# Patient Record
Sex: Female | Born: 1945
Health system: Southern US, Community
[De-identification: ages and names within clinical notes are randomized; demographics above are authoritative.]

## PROBLEM LIST (undated history)

## (undated) DIAGNOSIS — J302 Other seasonal allergic rhinitis: Secondary | ICD-10-CM

## (undated) DIAGNOSIS — K219 Gastro-esophageal reflux disease without esophagitis: Secondary | ICD-10-CM

## (undated) DIAGNOSIS — E7800 Pure hypercholesterolemia, unspecified: Secondary | ICD-10-CM

## (undated) DIAGNOSIS — K5792 Diverticulitis of intestine, part unspecified, without perforation or abscess without bleeding: Secondary | ICD-10-CM

## (undated) DIAGNOSIS — M5030 Other cervical disc degeneration, unspecified cervical region: Secondary | ICD-10-CM

## (undated) DIAGNOSIS — R42 Dizziness and giddiness: Secondary | ICD-10-CM

## (undated) DIAGNOSIS — F419 Anxiety disorder, unspecified: Secondary | ICD-10-CM

## (undated) DIAGNOSIS — M1990 Unspecified osteoarthritis, unspecified site: Secondary | ICD-10-CM

## (undated) HISTORY — PX: CATARACT EXTRACTION: SUR2

## (undated) HISTORY — PX: BREAST BIOPSY: SHX20

## (undated) HISTORY — PX: TONSILLECTOMY: SUR1361

## (undated) HISTORY — PX: COLONOSCOPY: SHX174

## (undated) HISTORY — PX: APPENDECTOMY: SHX54

## (undated) HISTORY — PX: ABDOMINAL HYSTERECTOMY: SHX81

---

## 2004-03-12 ENCOUNTER — Ambulatory Visit: Payer: Self-pay | Admitting: Family Medicine

## 2005-09-17 ENCOUNTER — Ambulatory Visit: Payer: Self-pay | Admitting: Family Medicine

## 2006-09-22 ENCOUNTER — Ambulatory Visit: Payer: Self-pay | Admitting: Family Medicine

## 2007-10-25 ENCOUNTER — Ambulatory Visit: Payer: Self-pay | Admitting: Family Medicine

## 2008-10-26 ENCOUNTER — Ambulatory Visit: Payer: Self-pay | Admitting: Family Medicine

## 2009-03-01 ENCOUNTER — Ambulatory Visit: Payer: Self-pay | Admitting: Ophthalmology

## 2009-03-12 ENCOUNTER — Ambulatory Visit: Payer: Self-pay | Admitting: Ophthalmology

## 2009-12-17 ENCOUNTER — Ambulatory Visit: Payer: Self-pay | Admitting: Family Medicine

## 2010-01-15 ENCOUNTER — Ambulatory Visit: Payer: Self-pay | Admitting: Gastroenterology

## 2010-07-25 ENCOUNTER — Ambulatory Visit: Payer: Self-pay | Admitting: Ophthalmology

## 2010-08-05 ENCOUNTER — Ambulatory Visit: Payer: Self-pay | Admitting: Ophthalmology

## 2011-01-15 ENCOUNTER — Ambulatory Visit: Payer: Self-pay | Admitting: Family Medicine

## 2011-05-01 ENCOUNTER — Ambulatory Visit: Payer: Self-pay | Admitting: General Practice

## 2011-05-20 ENCOUNTER — Ambulatory Visit: Payer: Self-pay | Admitting: General Practice

## 2012-02-04 ENCOUNTER — Ambulatory Visit: Payer: Self-pay | Admitting: Family Medicine

## 2013-02-11 ENCOUNTER — Ambulatory Visit: Payer: Self-pay | Admitting: Family Medicine

## 2014-03-02 ENCOUNTER — Ambulatory Visit: Payer: Self-pay | Admitting: Family Medicine

## 2015-01-17 ENCOUNTER — Encounter: Payer: Self-pay | Admitting: *Deleted

## 2015-01-26 NOTE — Discharge Instructions (Signed)

## 2015-01-30 ENCOUNTER — Encounter
Admission: RE | Disposition: A | Discharge status: Home / Self Care | Payer: Self-pay | Source: Ambulatory Visit | Attending: Gastroenterology

## 2015-01-30 ENCOUNTER — Ambulatory Visit
Admission: RE | Admit: 2015-01-30 | Discharge: 2015-01-30 | Disposition: A | Discharge status: Home / Self Care | Payer: PPO | Source: Ambulatory Visit | Attending: Gastroenterology | Admitting: Gastroenterology

## 2015-01-30 ENCOUNTER — Ambulatory Visit: Payer: PPO | Admitting: Anesthesiology

## 2015-01-30 ENCOUNTER — Encounter: Payer: Self-pay | Admitting: *Deleted

## 2015-01-30 DIAGNOSIS — Z800 Family history of malignant neoplasm of digestive organs: Secondary | ICD-10-CM | POA: Diagnosis not present

## 2015-01-30 DIAGNOSIS — Z87891 Personal history of nicotine dependence: Secondary | ICD-10-CM | POA: Diagnosis not present

## 2015-01-30 DIAGNOSIS — R194 Change in bowel habit: Secondary | ICD-10-CM | POA: Insufficient documentation

## 2015-01-30 DIAGNOSIS — K219 Gastro-esophageal reflux disease without esophagitis: Secondary | ICD-10-CM | POA: Insufficient documentation

## 2015-01-30 DIAGNOSIS — K5730 Diverticulosis of large intestine without perforation or abscess without bleeding: Secondary | ICD-10-CM | POA: Diagnosis not present

## 2015-01-30 HISTORY — DX: Anxiety disorder, unspecified: F41.9

## 2015-01-30 HISTORY — DX: Dizziness and giddiness: R42

## 2015-01-30 HISTORY — DX: Diverticulitis of intestine, part unspecified, without perforation or abscess without bleeding: K57.92

## 2015-01-30 HISTORY — PX: COLONOSCOPY: SHX5424

## 2015-01-30 HISTORY — DX: Other seasonal allergic rhinitis: J30.2

## 2015-01-30 HISTORY — DX: Pure hypercholesterolemia, unspecified: E78.00

## 2015-01-30 HISTORY — DX: Gastro-esophageal reflux disease without esophagitis: K21.9

## 2015-01-30 HISTORY — DX: Unspecified osteoarthritis, unspecified site: M19.90

## 2015-01-30 HISTORY — DX: Other cervical disc degeneration, unspecified cervical region: M50.30

## 2015-01-30 SURGERY — COLONOSCOPY
Anesthesia: General | Wound class: Contaminated

## 2015-01-30 MED ORDER — DEXAMETHASONE SODIUM PHOSPHATE 4 MG/ML IJ SOLN: 8.0000 mg | Freq: Once | INTRAMUSCULAR | Status: DC | PRN

## 2015-01-30 MED ORDER — LACTATED RINGERS IV SOLN
INTRAVENOUS | Status: DC
  Administered 2015-01-30: 08:00:00 via INTRAVENOUS

## 2015-01-30 MED ORDER — ACETAMINOPHEN 160 MG/5ML PO SOLN: 325.0000 mg | ORAL | Status: DC | PRN

## 2015-01-30 MED ORDER — FENTANYL CITRATE (PF) 100 MCG/2ML IJ SOLN: 25.0000 ug | INTRAMUSCULAR | Status: DC | PRN

## 2015-01-30 MED ORDER — OXYCODONE HCL 5 MG PO TABS: 5.0000 mg | ORAL_TABLET | Freq: Once | ORAL | Status: DC | PRN

## 2015-01-30 MED ORDER — LIDOCAINE HCL (CARDIAC) 20 MG/ML IV SOLN
INTRAVENOUS | Status: DC | PRN
  Administered 2015-01-30: 50 mg via INTRAVENOUS

## 2015-01-30 MED ORDER — OXYCODONE HCL 5 MG/5ML PO SOLN: 5.0000 mg | Freq: Once | ORAL | Status: DC | PRN

## 2015-01-30 MED ORDER — PROPOFOL 10 MG/ML IV BOLUS
INTRAVENOUS | Status: DC | PRN
  Administered 2015-01-30: 30 mg via INTRAVENOUS
  Administered 2015-01-30: 40 mg via INTRAVENOUS
  Administered 2015-01-30: 30 mg via INTRAVENOUS
  Administered 2015-01-30: 20 mg via INTRAVENOUS
  Administered 2015-01-30 (×2): 40 mg via INTRAVENOUS
  Administered 2015-01-30: 50 mg via INTRAVENOUS

## 2015-01-30 MED ORDER — SODIUM CHLORIDE 0.9 % IV SOLN: INTRAVENOUS | Status: DC

## 2015-01-30 MED ORDER — STERILE WATER FOR IRRIGATION IR SOLN
Status: DC | PRN
  Administered 2015-01-30: 09:00:00

## 2015-01-30 MED ORDER — ACETAMINOPHEN 325 MG PO TABS: 325.0000 mg | ORAL_TABLET | ORAL | Status: DC | PRN

## 2015-01-30 SURGICAL SUPPLY — 30 items
CANISTER SUCT 1200ML W/VALVE (MISCELLANEOUS) ×3 IMPLANT
FCP ESCP3.2XJMB 240X2.8X (MISCELLANEOUS)
FORCEPS BIOP RAD 4 LRG CAP 4 (CUTTING FORCEPS) ×3 IMPLANT
FORCEPS BIOP RJ4 240 W/NDL (MISCELLANEOUS)
FORCEPS ESCP3.2XJMB 240X2.8X (MISCELLANEOUS) IMPLANT
GOWN CVR UNV OPN BCK APRN NK (MISCELLANEOUS) ×1 IMPLANT
GOWN ISOL THUMB LOOP REG UNIV (MISCELLANEOUS) ×2
GOWN STRL REUS W/ TWL LRG LVL3 (GOWN DISPOSABLE) ×1 IMPLANT
GOWN STRL REUS W/TWL LRG LVL3 (GOWN DISPOSABLE) ×2
HEMOCLIP INSTINCT (CLIP) IMPLANT
INJECTOR VARIJECT VIN23 (MISCELLANEOUS) IMPLANT
KIT CO2 TUBING (TUBING) IMPLANT
KIT DEFENDO VALVE AND CONN (KITS) IMPLANT
KIT ENDO PROCEDURE OLY (KITS) ×3 IMPLANT
LIGATOR MULTIBAND 6SHOOTER MBL (MISCELLANEOUS) IMPLANT
MARKER SPOT ENDO TATTOO 5ML (MISCELLANEOUS) IMPLANT
PAD GROUND ADULT SPLIT (MISCELLANEOUS) IMPLANT
SNARE SHORT THROW 13M SML OVAL (MISCELLANEOUS) IMPLANT
SNARE SHORT THROW 30M LRG OVAL (MISCELLANEOUS) IMPLANT
SPOT EX ENDOSCOPIC TATTOO (MISCELLANEOUS)
SUCTION POLY TRAP 4CHAMBER (MISCELLANEOUS) IMPLANT
TRAP SUCTION POLY (MISCELLANEOUS) IMPLANT
TUBING CONN 6MMX3.1M (TUBING)
TUBING SUCTION CONN 0.25 STRL (TUBING) IMPLANT
UNDERPAD 30X60 958B10 (PK) (MISCELLANEOUS) IMPLANT
VALVE BIOPSY ENDO (VALVE) IMPLANT
VARIJECT INJECTOR VIN23 (MISCELLANEOUS)
WATER AUXILLARY (MISCELLANEOUS) IMPLANT
WATER STERILE IRR 250ML POUR (IV SOLUTION) IMPLANT
WATER STERILE IRR 500ML POUR (IV SOLUTION) IMPLANT

## 2015-01-30 NOTE — H&P (Signed)
  Date of Initial H&P:01/12/2015 History reviewed, patient examined, no change in status, stable for surgery. 

## 2015-01-30 NOTE — Anesthesia Preprocedure Evaluation (Signed)
Anesthesia Evaluation  Patient identified by MRN, date of birth, ID band Patient awake    Reviewed: Allergy & Precautions, H&P , NPO status , Patient's Chart, lab work & pertinent test results, reviewed documented beta blocker date and time   Airway Mallampati: II  TM Distance: >3 FB Neck ROM: full    Dental no notable dental hx.    Pulmonary neg pulmonary ROS, former smoker,    Pulmonary exam normal breath sounds clear to auscultation       Cardiovascular Exercise Tolerance: Good negative cardio ROS   Rhythm:regular Rate:Normal     Neuro/Psych negative neurological ROS  negative psych ROS   GI/Hepatic Neg liver ROS, GERD  Medicated,  Endo/Other  negative endocrine ROS  Renal/GU negative Renal ROS  negative genitourinary   Musculoskeletal   Abdominal   Peds  Hematology negative hematology ROS (+)   Anesthesia Other Findings   Reproductive/Obstetrics negative OB ROS                             Anesthesia Physical Anesthesia Plan  ASA: II  Anesthesia Plan: General   Post-op Pain Management:    Induction:   Airway Management Planned:   Additional Equipment:   Intra-op Plan:   Post-operative Plan:   Informed Consent: I have reviewed the patients History and Physical, chart, labs and discussed the procedure including the risks, benefits and alternatives for the proposed anesthesia with the patient or authorized representative who has indicated his/her understanding and acceptance.     Plan Discussed with: CRNA  Anesthesia Plan Comments:         Anesthesia Quick Evaluation

## 2015-01-30 NOTE — Op Note (Signed)
[facility] Patient Name: Sheletha Bow Procedure Date: 01/30/2015 8:58 AM MRN: [id number] Account #: [id number] Date of Birth: 1946-03-23 Admit Type: Outpatient Age: 69 Room: [facility] OR ROOM 01 Gender: Female Note Status: Finalized Procedure:         Colonoscopy Indications:       Family history of rectal cancer in a first-degree                     relative, Change in bowel habits Providers:         Ezzard Standing. Bluford Kaufmann, MD Referring MD:      Marina Goodell (Referring MD) Medicines:         Monitored Anesthesia Care Complications:     No immediate complications. Procedure:         Pre-Anesthesia Assessment:                    - Prior to the procedure, a History and Physical was                     performed, and patient medications, allergies and                     sensitivities were reviewed. The patient's tolerance of                     previous anesthesia was reviewed.                    - The risks and benefits of the procedure and the sedation                     options and risks were discussed with the patient. All                     questions were answered and informed consent was obtained.                    - After reviewing the risks and benefits, the patient was                     deemed in satisfactory condition to undergo the procedure.                    After obtaining informed consent, the colonoscope was                     passed under direct vision. Throughout the procedure, the                     patient's blood pressure, pulse, and oxygen saturations                     were monitored continuously. The was introduced through                     the anus and advanced to the the cecum, identified by                     appendiceal orifice and ileocecal valve. The colonoscopy                     was performed without difficulty. The patient tolerated  the procedure well. The quality of the bowel preparation               was good. Findings:      Multiple small and large-mouthed diverticula were found in the sigmoid       colon. Biopsies for histology were taken with a cold forceps from the       entire colon for evaluation of microscopic colitis.      The exam was otherwise without abnormality. Impression:        - Diverticulosis in the sigmoid colon. Biopsied.                    - The examination was otherwise normal. Recommendation:    - Discharge patient to home.                    - Await pathology results.                    - Repeat colonoscopy in 5 years for surveillance.                    - The findings and recommendations were discussed with the                     patient's family. Procedure Code(s): --- Professional ---                    [phone], Colonoscopy, flexible; with biopsy, single or                     multiple Diagnosis Code(s): --- Professional ---                    Z80.0, Family history of malignant neoplasm of digestive                     organs                    R19.4, Change in bowel habit                    K57.30, Diverticulosis of large intestine without                     perforation or abscess without bleeding CPT copyright 2014 [employer]. All rights reserved. The codes documented in this report are preliminary and upon coder review may  be revised to meet current compliance requirements. Wallace Cullens, MD 01/30/2015 9:22:24 AM This report has been signed electronically. Number of Addenda: 0 Note Initiated On: 01/30/2015 8:58 AM Scope Withdrawal Time: 0 hours 4 minutes 17 seconds  Total Procedure Duration: 0 hours 11 minutes 18 seconds       [facility]

## 2015-01-30 NOTE — Anesthesia Postprocedure Evaluation (Signed)
  Anesthesia Post-op Note  Patient: Julie Downs  Procedure(s) Performed: Procedure(s): COLONOSCOPY (N/A)  Anesthesia type:General  Patient location: PACU  Post pain: Pain level controlled  Post assessment: Post-op Vital signs reviewed, Patient's Cardiovascular Status Stable, Respiratory Function Stable, Patent Airway and No signs of Nausea or vomiting  Post vital signs: Reviewed and stable  Last Vitals:  Filed Vitals:   01/30/15 0931  BP: 116/61  Pulse: 81  Temp:   Resp: 22    Level of consciousness: awake, alert  and patient cooperative  Complications: No apparent anesthesia complications

## 2015-01-30 NOTE — Transfer of Care (Signed)
Immediate Anesthesia Transfer of Care Note  Patient: Julie Downs  Procedure(s) Performed: Procedure(s): COLONOSCOPY (N/A)  Patient Location: PACU  Anesthesia Type: General  Level of Consciousness: awake, alert  and patient cooperative  Airway and Oxygen Therapy: Patient Spontanous Breathing and Patient connected to supplemental oxygen  Post-op Assessment: Post-op Vital signs reviewed, Patient's Cardiovascular Status Stable, Respiratory Function Stable, Patent Airway and No signs of Nausea or vomiting  Post-op Vital Signs: Reviewed and stable  Complications: No apparent anesthesia complications

## 2015-01-31 ENCOUNTER — Encounter: Payer: Self-pay | Admitting: Gastroenterology

## 2015-02-01 LAB — SURGICAL PATHOLOGY

## 2015-06-01 DIAGNOSIS — F419 Anxiety disorder, unspecified: Secondary | ICD-10-CM | POA: Diagnosis not present

## 2015-06-01 DIAGNOSIS — R000 Tachycardia, unspecified: Secondary | ICD-10-CM | POA: Diagnosis not present

## 2015-07-24 DIAGNOSIS — Z961 Presence of intraocular lens: Secondary | ICD-10-CM | POA: Diagnosis not present

## 2015-08-10 DIAGNOSIS — F419 Anxiety disorder, unspecified: Secondary | ICD-10-CM | POA: Diagnosis not present

## 2015-08-10 DIAGNOSIS — E7800 Pure hypercholesterolemia, unspecified: Secondary | ICD-10-CM | POA: Diagnosis not present

## 2015-08-10 DIAGNOSIS — J301 Allergic rhinitis due to pollen: Secondary | ICD-10-CM | POA: Diagnosis not present

## 2015-08-10 DIAGNOSIS — K219 Gastro-esophageal reflux disease without esophagitis: Secondary | ICD-10-CM | POA: Diagnosis not present

## 2015-08-10 DIAGNOSIS — R7302 Impaired glucose tolerance (oral): Secondary | ICD-10-CM | POA: Diagnosis not present

## 2016-02-14 DIAGNOSIS — F419 Anxiety disorder, unspecified: Secondary | ICD-10-CM | POA: Diagnosis not present

## 2016-02-14 DIAGNOSIS — J301 Allergic rhinitis due to pollen: Secondary | ICD-10-CM | POA: Diagnosis not present

## 2016-02-14 DIAGNOSIS — E7800 Pure hypercholesterolemia, unspecified: Secondary | ICD-10-CM | POA: Diagnosis not present

## 2016-02-14 DIAGNOSIS — R7302 Impaired glucose tolerance (oral): Secondary | ICD-10-CM | POA: Diagnosis not present

## 2016-02-14 DIAGNOSIS — K219 Gastro-esophageal reflux disease without esophagitis: Secondary | ICD-10-CM | POA: Diagnosis not present

## 2016-02-14 DIAGNOSIS — R61 Generalized hyperhidrosis: Secondary | ICD-10-CM | POA: Diagnosis not present

## 2016-03-19 DIAGNOSIS — J0100 Acute maxillary sinusitis, unspecified: Secondary | ICD-10-CM | POA: Diagnosis not present

## 2016-03-19 DIAGNOSIS — J301 Allergic rhinitis due to pollen: Secondary | ICD-10-CM | POA: Diagnosis not present

## 2016-04-23 DIAGNOSIS — J301 Allergic rhinitis due to pollen: Secondary | ICD-10-CM | POA: Diagnosis not present

## 2016-04-23 DIAGNOSIS — R0982 Postnasal drip: Secondary | ICD-10-CM | POA: Diagnosis not present

## 2016-06-03 DIAGNOSIS — J301 Allergic rhinitis due to pollen: Secondary | ICD-10-CM | POA: Diagnosis not present

## 2016-08-18 DIAGNOSIS — E7800 Pure hypercholesterolemia, unspecified: Secondary | ICD-10-CM | POA: Diagnosis not present

## 2016-08-18 DIAGNOSIS — J301 Allergic rhinitis due to pollen: Secondary | ICD-10-CM | POA: Diagnosis not present

## 2016-08-18 DIAGNOSIS — F419 Anxiety disorder, unspecified: Secondary | ICD-10-CM | POA: Diagnosis not present

## 2016-08-18 DIAGNOSIS — R7302 Impaired glucose tolerance (oral): Secondary | ICD-10-CM | POA: Diagnosis not present

## 2016-08-18 DIAGNOSIS — K219 Gastro-esophageal reflux disease without esophagitis: Secondary | ICD-10-CM | POA: Diagnosis not present

## 2016-09-04 DIAGNOSIS — H43813 Vitreous degeneration, bilateral: Secondary | ICD-10-CM | POA: Diagnosis not present

## 2017-02-23 DIAGNOSIS — J301 Allergic rhinitis due to pollen: Secondary | ICD-10-CM | POA: Diagnosis not present

## 2017-02-23 DIAGNOSIS — K219 Gastro-esophageal reflux disease without esophagitis: Secondary | ICD-10-CM | POA: Diagnosis not present

## 2017-02-23 DIAGNOSIS — Z0000 Encounter for general adult medical examination without abnormal findings: Secondary | ICD-10-CM | POA: Diagnosis not present

## 2017-02-23 DIAGNOSIS — F419 Anxiety disorder, unspecified: Secondary | ICD-10-CM | POA: Diagnosis not present

## 2017-02-23 DIAGNOSIS — R7302 Impaired glucose tolerance (oral): Secondary | ICD-10-CM | POA: Diagnosis not present

## 2017-02-23 DIAGNOSIS — E7800 Pure hypercholesterolemia, unspecified: Secondary | ICD-10-CM | POA: Diagnosis not present

## 2017-04-13 ENCOUNTER — Other Ambulatory Visit: Payer: Self-pay | Admitting: Pharmacy Technician

## 2017-04-13 NOTE — Patient Outreach (Signed)
[facility]  04/13/2017  Julie LorenzoWanda D Downs 09/15/1945 [id number]  Incoming Emmi call in reference to medication adherence for Atovastatin. HIPAA identifiers verified and verbal consent received. [name] states she takes Atorvastatin daily but may occasionally miss a dose. She expressed interest in getting 3 month supplies so I offered to contact her provider. When I call the office of Dr. Alinda DeemFeldspausch the representative would not allow me to make the request on the patient's behalf. [facility] and they are going to contact the office to make the request.  Daryll Brodrystal Elbert Spickler, CPhT [facility] [phone]

## 2017-08-27 DIAGNOSIS — K219 Gastro-esophageal reflux disease without esophagitis: Secondary | ICD-10-CM | POA: Diagnosis not present

## 2017-08-27 DIAGNOSIS — E119 Type 2 diabetes mellitus without complications: Secondary | ICD-10-CM | POA: Diagnosis not present

## 2017-08-27 DIAGNOSIS — F419 Anxiety disorder, unspecified: Secondary | ICD-10-CM | POA: Diagnosis not present

## 2017-08-27 DIAGNOSIS — E7800 Pure hypercholesterolemia, unspecified: Secondary | ICD-10-CM | POA: Diagnosis not present

## 2017-08-27 DIAGNOSIS — J301 Allergic rhinitis due to pollen: Secondary | ICD-10-CM | POA: Diagnosis not present

## 2018-03-05 DIAGNOSIS — E7800 Pure hypercholesterolemia, unspecified: Secondary | ICD-10-CM | POA: Diagnosis not present

## 2018-03-05 DIAGNOSIS — K219 Gastro-esophageal reflux disease without esophagitis: Secondary | ICD-10-CM | POA: Diagnosis not present

## 2018-03-05 DIAGNOSIS — F419 Anxiety disorder, unspecified: Secondary | ICD-10-CM | POA: Diagnosis not present

## 2018-03-05 DIAGNOSIS — Z0000 Encounter for general adult medical examination without abnormal findings: Secondary | ICD-10-CM | POA: Diagnosis not present

## 2018-03-05 DIAGNOSIS — E119 Type 2 diabetes mellitus without complications: Secondary | ICD-10-CM | POA: Diagnosis not present

## 2018-03-05 DIAGNOSIS — J301 Allergic rhinitis due to pollen: Secondary | ICD-10-CM | POA: Diagnosis not present

## 2018-09-09 DIAGNOSIS — K219 Gastro-esophageal reflux disease without esophagitis: Secondary | ICD-10-CM | POA: Diagnosis not present

## 2018-09-09 DIAGNOSIS — E7800 Pure hypercholesterolemia, unspecified: Secondary | ICD-10-CM | POA: Diagnosis not present

## 2018-09-09 DIAGNOSIS — F419 Anxiety disorder, unspecified: Secondary | ICD-10-CM | POA: Diagnosis not present

## 2018-09-09 DIAGNOSIS — E119 Type 2 diabetes mellitus without complications: Secondary | ICD-10-CM | POA: Diagnosis not present

## 2018-09-09 DIAGNOSIS — J301 Allergic rhinitis due to pollen: Secondary | ICD-10-CM | POA: Diagnosis not present

## 2019-03-17 ENCOUNTER — Other Ambulatory Visit: Payer: Self-pay | Admitting: Family Medicine

## 2019-03-17 DIAGNOSIS — K219 Gastro-esophageal reflux disease without esophagitis: Secondary | ICD-10-CM | POA: Diagnosis not present

## 2019-03-17 DIAGNOSIS — Z1231 Encounter for screening mammogram for malignant neoplasm of breast: Secondary | ICD-10-CM

## 2019-03-17 DIAGNOSIS — Z0000 Encounter for general adult medical examination without abnormal findings: Secondary | ICD-10-CM | POA: Diagnosis not present

## 2019-03-17 DIAGNOSIS — E7800 Pure hypercholesterolemia, unspecified: Secondary | ICD-10-CM | POA: Diagnosis not present

## 2019-03-17 DIAGNOSIS — E119 Type 2 diabetes mellitus without complications: Secondary | ICD-10-CM | POA: Diagnosis not present

## 2019-03-17 DIAGNOSIS — F419 Anxiety disorder, unspecified: Secondary | ICD-10-CM | POA: Diagnosis not present

## 2019-03-17 DIAGNOSIS — J301 Allergic rhinitis due to pollen: Secondary | ICD-10-CM | POA: Diagnosis not present

## 2019-06-21 ENCOUNTER — Inpatient Hospital Stay: Admission: RE | Admit: 2019-06-21 | Payer: PPO | Source: Ambulatory Visit

## 2019-06-22 DIAGNOSIS — Z20828 Contact with and (suspected) exposure to other viral communicable diseases: Secondary | ICD-10-CM | POA: Diagnosis not present

## 2019-06-23 DIAGNOSIS — J069 Acute upper respiratory infection, unspecified: Secondary | ICD-10-CM | POA: Diagnosis not present

## 2019-06-23 DIAGNOSIS — R05 Cough: Secondary | ICD-10-CM | POA: Diagnosis not present

## 2019-06-23 DIAGNOSIS — R509 Fever, unspecified: Secondary | ICD-10-CM | POA: Diagnosis not present

## 2019-06-27 ENCOUNTER — Inpatient Hospital Stay: Admission: RE | Admit: 2019-06-27 | Payer: PPO | Source: Ambulatory Visit

## 2019-07-12 ENCOUNTER — Other Ambulatory Visit: Payer: Self-pay

## 2019-07-12 ENCOUNTER — Ambulatory Visit
Admission: RE | Admit: 2019-07-12 | Discharge: 2019-07-12 | Disposition: A | Discharge status: Home / Self Care | Payer: PPO | Source: Ambulatory Visit | Attending: Family Medicine | Admitting: Family Medicine

## 2019-07-12 DIAGNOSIS — Z1231 Encounter for screening mammogram for malignant neoplasm of breast: Secondary | ICD-10-CM | POA: Diagnosis not present

## 2019-09-22 DIAGNOSIS — F419 Anxiety disorder, unspecified: Secondary | ICD-10-CM | POA: Diagnosis not present

## 2019-09-22 DIAGNOSIS — E7800 Pure hypercholesterolemia, unspecified: Secondary | ICD-10-CM | POA: Diagnosis not present

## 2019-09-22 DIAGNOSIS — E119 Type 2 diabetes mellitus without complications: Secondary | ICD-10-CM | POA: Diagnosis not present

## 2019-09-22 DIAGNOSIS — J301 Allergic rhinitis due to pollen: Secondary | ICD-10-CM | POA: Diagnosis not present

## 2019-09-22 DIAGNOSIS — K219 Gastro-esophageal reflux disease without esophagitis: Secondary | ICD-10-CM | POA: Diagnosis not present

## 2020-01-09 DIAGNOSIS — J0100 Acute maxillary sinusitis, unspecified: Secondary | ICD-10-CM | POA: Diagnosis not present

## 2020-01-09 DIAGNOSIS — R05 Cough: Secondary | ICD-10-CM | POA: Diagnosis not present

## 2020-02-15 DIAGNOSIS — M1712 Unilateral primary osteoarthritis, left knee: Secondary | ICD-10-CM | POA: Diagnosis not present

## 2020-02-15 DIAGNOSIS — M7652 Patellar tendinitis, left knee: Secondary | ICD-10-CM | POA: Diagnosis not present

## 2020-04-19 DIAGNOSIS — K219 Gastro-esophageal reflux disease without esophagitis: Secondary | ICD-10-CM | POA: Diagnosis not present

## 2020-04-19 DIAGNOSIS — J301 Allergic rhinitis due to pollen: Secondary | ICD-10-CM | POA: Diagnosis not present

## 2020-04-19 DIAGNOSIS — Z0000 Encounter for general adult medical examination without abnormal findings: Secondary | ICD-10-CM | POA: Diagnosis not present

## 2020-04-19 DIAGNOSIS — M1712 Unilateral primary osteoarthritis, left knee: Secondary | ICD-10-CM | POA: Diagnosis not present

## 2020-04-19 DIAGNOSIS — E7800 Pure hypercholesterolemia, unspecified: Secondary | ICD-10-CM | POA: Diagnosis not present

## 2020-04-19 DIAGNOSIS — F419 Anxiety disorder, unspecified: Secondary | ICD-10-CM | POA: Diagnosis not present

## 2020-04-19 DIAGNOSIS — E119 Type 2 diabetes mellitus without complications: Secondary | ICD-10-CM | POA: Diagnosis not present

## 2020-04-20 DIAGNOSIS — E7800 Pure hypercholesterolemia, unspecified: Secondary | ICD-10-CM | POA: Diagnosis not present

## 2020-04-20 DIAGNOSIS — E119 Type 2 diabetes mellitus without complications: Secondary | ICD-10-CM | POA: Diagnosis not present

## 2020-04-26 DIAGNOSIS — E669 Obesity, unspecified: Secondary | ICD-10-CM | POA: Diagnosis not present

## 2020-04-26 DIAGNOSIS — M2352 Chronic instability of knee, left knee: Secondary | ICD-10-CM | POA: Diagnosis not present

## 2020-04-26 DIAGNOSIS — M2392 Unspecified internal derangement of left knee: Secondary | ICD-10-CM | POA: Diagnosis not present

## 2020-06-11 DIAGNOSIS — M5413 Radiculopathy, cervicothoracic region: Secondary | ICD-10-CM | POA: Diagnosis not present

## 2020-06-11 DIAGNOSIS — M9901 Segmental and somatic dysfunction of cervical region: Secondary | ICD-10-CM | POA: Diagnosis not present

## 2020-06-11 DIAGNOSIS — M25512 Pain in left shoulder: Secondary | ICD-10-CM | POA: Diagnosis not present

## 2020-06-11 DIAGNOSIS — M542 Cervicalgia: Secondary | ICD-10-CM | POA: Diagnosis not present

## 2020-06-13 DIAGNOSIS — M9901 Segmental and somatic dysfunction of cervical region: Secondary | ICD-10-CM | POA: Diagnosis not present

## 2020-06-13 DIAGNOSIS — M25512 Pain in left shoulder: Secondary | ICD-10-CM | POA: Diagnosis not present

## 2020-06-13 DIAGNOSIS — M542 Cervicalgia: Secondary | ICD-10-CM | POA: Diagnosis not present

## 2020-06-13 DIAGNOSIS — M5413 Radiculopathy, cervicothoracic region: Secondary | ICD-10-CM | POA: Diagnosis not present

## 2020-06-14 DIAGNOSIS — M542 Cervicalgia: Secondary | ICD-10-CM | POA: Diagnosis not present

## 2020-06-14 DIAGNOSIS — M9901 Segmental and somatic dysfunction of cervical region: Secondary | ICD-10-CM | POA: Diagnosis not present

## 2020-06-14 DIAGNOSIS — M5413 Radiculopathy, cervicothoracic region: Secondary | ICD-10-CM | POA: Diagnosis not present

## 2020-06-14 DIAGNOSIS — M25512 Pain in left shoulder: Secondary | ICD-10-CM | POA: Diagnosis not present

## 2020-06-18 DIAGNOSIS — M25512 Pain in left shoulder: Secondary | ICD-10-CM | POA: Diagnosis not present

## 2020-06-18 DIAGNOSIS — M5413 Radiculopathy, cervicothoracic region: Secondary | ICD-10-CM | POA: Diagnosis not present

## 2020-06-18 DIAGNOSIS — M9901 Segmental and somatic dysfunction of cervical region: Secondary | ICD-10-CM | POA: Diagnosis not present

## 2020-06-18 DIAGNOSIS — M542 Cervicalgia: Secondary | ICD-10-CM | POA: Diagnosis not present

## 2020-06-20 DIAGNOSIS — M542 Cervicalgia: Secondary | ICD-10-CM | POA: Diagnosis not present

## 2020-06-20 DIAGNOSIS — M9901 Segmental and somatic dysfunction of cervical region: Secondary | ICD-10-CM | POA: Diagnosis not present

## 2020-06-20 DIAGNOSIS — M25512 Pain in left shoulder: Secondary | ICD-10-CM | POA: Diagnosis not present

## 2020-06-20 DIAGNOSIS — M5413 Radiculopathy, cervicothoracic region: Secondary | ICD-10-CM | POA: Diagnosis not present

## 2020-06-21 DIAGNOSIS — M5413 Radiculopathy, cervicothoracic region: Secondary | ICD-10-CM | POA: Diagnosis not present

## 2020-06-21 DIAGNOSIS — M25512 Pain in left shoulder: Secondary | ICD-10-CM | POA: Diagnosis not present

## 2020-06-21 DIAGNOSIS — M542 Cervicalgia: Secondary | ICD-10-CM | POA: Diagnosis not present

## 2020-06-21 DIAGNOSIS — M9901 Segmental and somatic dysfunction of cervical region: Secondary | ICD-10-CM | POA: Diagnosis not present

## 2020-06-25 DIAGNOSIS — M9901 Segmental and somatic dysfunction of cervical region: Secondary | ICD-10-CM | POA: Diagnosis not present

## 2020-06-25 DIAGNOSIS — M5413 Radiculopathy, cervicothoracic region: Secondary | ICD-10-CM | POA: Diagnosis not present

## 2020-06-25 DIAGNOSIS — M25512 Pain in left shoulder: Secondary | ICD-10-CM | POA: Diagnosis not present

## 2020-06-25 DIAGNOSIS — M542 Cervicalgia: Secondary | ICD-10-CM | POA: Diagnosis not present

## 2020-06-27 DIAGNOSIS — M25512 Pain in left shoulder: Secondary | ICD-10-CM | POA: Diagnosis not present

## 2020-06-27 DIAGNOSIS — M542 Cervicalgia: Secondary | ICD-10-CM | POA: Diagnosis not present

## 2020-06-27 DIAGNOSIS — M9901 Segmental and somatic dysfunction of cervical region: Secondary | ICD-10-CM | POA: Diagnosis not present

## 2020-06-27 DIAGNOSIS — M5413 Radiculopathy, cervicothoracic region: Secondary | ICD-10-CM | POA: Diagnosis not present

## 2020-06-28 DIAGNOSIS — M542 Cervicalgia: Secondary | ICD-10-CM | POA: Diagnosis not present

## 2020-06-28 DIAGNOSIS — M5413 Radiculopathy, cervicothoracic region: Secondary | ICD-10-CM | POA: Diagnosis not present

## 2020-06-28 DIAGNOSIS — M25512 Pain in left shoulder: Secondary | ICD-10-CM | POA: Diagnosis not present

## 2020-06-28 DIAGNOSIS — M9901 Segmental and somatic dysfunction of cervical region: Secondary | ICD-10-CM | POA: Diagnosis not present

## 2020-07-02 DIAGNOSIS — M25512 Pain in left shoulder: Secondary | ICD-10-CM | POA: Diagnosis not present

## 2020-07-02 DIAGNOSIS — M5413 Radiculopathy, cervicothoracic region: Secondary | ICD-10-CM | POA: Diagnosis not present

## 2020-07-02 DIAGNOSIS — M9901 Segmental and somatic dysfunction of cervical region: Secondary | ICD-10-CM | POA: Diagnosis not present

## 2020-07-02 DIAGNOSIS — M542 Cervicalgia: Secondary | ICD-10-CM | POA: Diagnosis not present

## 2020-07-04 DIAGNOSIS — M5413 Radiculopathy, cervicothoracic region: Secondary | ICD-10-CM | POA: Diagnosis not present

## 2020-07-04 DIAGNOSIS — M542 Cervicalgia: Secondary | ICD-10-CM | POA: Diagnosis not present

## 2020-07-04 DIAGNOSIS — M25512 Pain in left shoulder: Secondary | ICD-10-CM | POA: Diagnosis not present

## 2020-07-04 DIAGNOSIS — M9901 Segmental and somatic dysfunction of cervical region: Secondary | ICD-10-CM | POA: Diagnosis not present

## 2020-07-09 DIAGNOSIS — M9901 Segmental and somatic dysfunction of cervical region: Secondary | ICD-10-CM | POA: Diagnosis not present

## 2020-07-09 DIAGNOSIS — M5413 Radiculopathy, cervicothoracic region: Secondary | ICD-10-CM | POA: Diagnosis not present

## 2020-07-09 DIAGNOSIS — M25512 Pain in left shoulder: Secondary | ICD-10-CM | POA: Diagnosis not present

## 2020-07-09 DIAGNOSIS — M542 Cervicalgia: Secondary | ICD-10-CM | POA: Diagnosis not present

## 2020-07-12 DIAGNOSIS — M25512 Pain in left shoulder: Secondary | ICD-10-CM | POA: Diagnosis not present

## 2020-07-12 DIAGNOSIS — M542 Cervicalgia: Secondary | ICD-10-CM | POA: Diagnosis not present

## 2020-07-12 DIAGNOSIS — M5413 Radiculopathy, cervicothoracic region: Secondary | ICD-10-CM | POA: Diagnosis not present

## 2020-07-12 DIAGNOSIS — M9901 Segmental and somatic dysfunction of cervical region: Secondary | ICD-10-CM | POA: Diagnosis not present

## 2020-07-16 DIAGNOSIS — M5413 Radiculopathy, cervicothoracic region: Secondary | ICD-10-CM | POA: Diagnosis not present

## 2020-07-16 DIAGNOSIS — M9901 Segmental and somatic dysfunction of cervical region: Secondary | ICD-10-CM | POA: Diagnosis not present

## 2020-07-16 DIAGNOSIS — M25512 Pain in left shoulder: Secondary | ICD-10-CM | POA: Diagnosis not present

## 2020-07-16 DIAGNOSIS — M542 Cervicalgia: Secondary | ICD-10-CM | POA: Diagnosis not present

## 2020-07-19 DIAGNOSIS — M25512 Pain in left shoulder: Secondary | ICD-10-CM | POA: Diagnosis not present

## 2020-07-19 DIAGNOSIS — M542 Cervicalgia: Secondary | ICD-10-CM | POA: Diagnosis not present

## 2020-07-19 DIAGNOSIS — M5413 Radiculopathy, cervicothoracic region: Secondary | ICD-10-CM | POA: Diagnosis not present

## 2020-07-19 DIAGNOSIS — M9901 Segmental and somatic dysfunction of cervical region: Secondary | ICD-10-CM | POA: Diagnosis not present

## 2020-07-23 DIAGNOSIS — M542 Cervicalgia: Secondary | ICD-10-CM | POA: Diagnosis not present

## 2020-07-23 DIAGNOSIS — M5413 Radiculopathy, cervicothoracic region: Secondary | ICD-10-CM | POA: Diagnosis not present

## 2020-07-23 DIAGNOSIS — M9901 Segmental and somatic dysfunction of cervical region: Secondary | ICD-10-CM | POA: Diagnosis not present

## 2020-07-23 DIAGNOSIS — M25512 Pain in left shoulder: Secondary | ICD-10-CM | POA: Diagnosis not present

## 2020-07-26 DIAGNOSIS — M25512 Pain in left shoulder: Secondary | ICD-10-CM | POA: Diagnosis not present

## 2020-07-26 DIAGNOSIS — M5413 Radiculopathy, cervicothoracic region: Secondary | ICD-10-CM | POA: Diagnosis not present

## 2020-07-26 DIAGNOSIS — M9901 Segmental and somatic dysfunction of cervical region: Secondary | ICD-10-CM | POA: Diagnosis not present

## 2020-07-26 DIAGNOSIS — M542 Cervicalgia: Secondary | ICD-10-CM | POA: Diagnosis not present

## 2020-07-30 DIAGNOSIS — M542 Cervicalgia: Secondary | ICD-10-CM | POA: Diagnosis not present

## 2020-07-30 DIAGNOSIS — M25512 Pain in left shoulder: Secondary | ICD-10-CM | POA: Diagnosis not present

## 2020-07-30 DIAGNOSIS — M5413 Radiculopathy, cervicothoracic region: Secondary | ICD-10-CM | POA: Diagnosis not present

## 2020-07-30 DIAGNOSIS — M9901 Segmental and somatic dysfunction of cervical region: Secondary | ICD-10-CM | POA: Diagnosis not present

## 2020-08-02 DIAGNOSIS — M5413 Radiculopathy, cervicothoracic region: Secondary | ICD-10-CM | POA: Diagnosis not present

## 2020-08-02 DIAGNOSIS — M9901 Segmental and somatic dysfunction of cervical region: Secondary | ICD-10-CM | POA: Diagnosis not present

## 2020-08-02 DIAGNOSIS — M25512 Pain in left shoulder: Secondary | ICD-10-CM | POA: Diagnosis not present

## 2020-08-02 DIAGNOSIS — M542 Cervicalgia: Secondary | ICD-10-CM | POA: Diagnosis not present

## 2020-08-06 DIAGNOSIS — M25512 Pain in left shoulder: Secondary | ICD-10-CM | POA: Diagnosis not present

## 2020-08-06 DIAGNOSIS — M9901 Segmental and somatic dysfunction of cervical region: Secondary | ICD-10-CM | POA: Diagnosis not present

## 2020-08-06 DIAGNOSIS — M5413 Radiculopathy, cervicothoracic region: Secondary | ICD-10-CM | POA: Diagnosis not present

## 2020-08-06 DIAGNOSIS — M542 Cervicalgia: Secondary | ICD-10-CM | POA: Diagnosis not present

## 2020-08-09 DIAGNOSIS — M25512 Pain in left shoulder: Secondary | ICD-10-CM | POA: Diagnosis not present

## 2020-08-09 DIAGNOSIS — M5413 Radiculopathy, cervicothoracic region: Secondary | ICD-10-CM | POA: Diagnosis not present

## 2020-08-09 DIAGNOSIS — M542 Cervicalgia: Secondary | ICD-10-CM | POA: Diagnosis not present

## 2020-08-09 DIAGNOSIS — M9901 Segmental and somatic dysfunction of cervical region: Secondary | ICD-10-CM | POA: Diagnosis not present

## 2020-08-13 DIAGNOSIS — M5413 Radiculopathy, cervicothoracic region: Secondary | ICD-10-CM | POA: Diagnosis not present

## 2020-08-13 DIAGNOSIS — M9901 Segmental and somatic dysfunction of cervical region: Secondary | ICD-10-CM | POA: Diagnosis not present

## 2020-08-13 DIAGNOSIS — M542 Cervicalgia: Secondary | ICD-10-CM | POA: Diagnosis not present

## 2020-08-13 DIAGNOSIS — M25512 Pain in left shoulder: Secondary | ICD-10-CM | POA: Diagnosis not present

## 2020-08-15 DIAGNOSIS — M542 Cervicalgia: Secondary | ICD-10-CM | POA: Diagnosis not present

## 2020-08-15 DIAGNOSIS — M5413 Radiculopathy, cervicothoracic region: Secondary | ICD-10-CM | POA: Diagnosis not present

## 2020-08-15 DIAGNOSIS — M9901 Segmental and somatic dysfunction of cervical region: Secondary | ICD-10-CM | POA: Diagnosis not present

## 2020-08-15 DIAGNOSIS — M25512 Pain in left shoulder: Secondary | ICD-10-CM | POA: Diagnosis not present

## 2020-08-20 DIAGNOSIS — M5413 Radiculopathy, cervicothoracic region: Secondary | ICD-10-CM | POA: Diagnosis not present

## 2020-08-20 DIAGNOSIS — M9901 Segmental and somatic dysfunction of cervical region: Secondary | ICD-10-CM | POA: Diagnosis not present

## 2020-08-20 DIAGNOSIS — M25512 Pain in left shoulder: Secondary | ICD-10-CM | POA: Diagnosis not present

## 2020-08-20 DIAGNOSIS — M542 Cervicalgia: Secondary | ICD-10-CM | POA: Diagnosis not present

## 2020-08-21 ENCOUNTER — Other Ambulatory Visit: Payer: Self-pay | Admitting: Family Medicine

## 2020-08-21 DIAGNOSIS — Z1231 Encounter for screening mammogram for malignant neoplasm of breast: Secondary | ICD-10-CM

## 2020-08-23 ENCOUNTER — Ambulatory Visit
Admission: RE | Admit: 2020-08-23 | Discharge: 2020-08-23 | Disposition: A | Discharge status: Home / Self Care | Payer: PPO | Source: Ambulatory Visit | Attending: Family Medicine | Admitting: Family Medicine

## 2020-08-23 ENCOUNTER — Other Ambulatory Visit: Payer: Self-pay

## 2020-08-23 ENCOUNTER — Ambulatory Visit: Payer: PPO

## 2020-08-23 DIAGNOSIS — Z1231 Encounter for screening mammogram for malignant neoplasm of breast: Secondary | ICD-10-CM | POA: Insufficient documentation

## 2020-11-13 DIAGNOSIS — Z0000 Encounter for general adult medical examination without abnormal findings: Secondary | ICD-10-CM | POA: Diagnosis not present

## 2020-11-13 DIAGNOSIS — F419 Anxiety disorder, unspecified: Secondary | ICD-10-CM | POA: Diagnosis not present

## 2020-11-13 DIAGNOSIS — K219 Gastro-esophageal reflux disease without esophagitis: Secondary | ICD-10-CM | POA: Diagnosis not present

## 2020-11-13 DIAGNOSIS — M1712 Unilateral primary osteoarthritis, left knee: Secondary | ICD-10-CM | POA: Diagnosis not present

## 2020-11-13 DIAGNOSIS — E119 Type 2 diabetes mellitus without complications: Secondary | ICD-10-CM | POA: Diagnosis not present

## 2020-11-13 DIAGNOSIS — E7800 Pure hypercholesterolemia, unspecified: Secondary | ICD-10-CM | POA: Diagnosis not present

## 2020-11-13 DIAGNOSIS — J301 Allergic rhinitis due to pollen: Secondary | ICD-10-CM | POA: Diagnosis not present

## 2021-01-07 DIAGNOSIS — K582 Mixed irritable bowel syndrome: Secondary | ICD-10-CM | POA: Diagnosis not present

## 2021-01-07 DIAGNOSIS — Z800 Family history of malignant neoplasm of digestive organs: Secondary | ICD-10-CM | POA: Diagnosis not present

## 2021-05-23 DIAGNOSIS — E119 Type 2 diabetes mellitus without complications: Secondary | ICD-10-CM | POA: Diagnosis not present

## 2021-05-23 DIAGNOSIS — E7800 Pure hypercholesterolemia, unspecified: Secondary | ICD-10-CM | POA: Diagnosis not present

## 2021-05-23 DIAGNOSIS — K219 Gastro-esophageal reflux disease without esophagitis: Secondary | ICD-10-CM | POA: Diagnosis not present

## 2021-05-23 DIAGNOSIS — F419 Anxiety disorder, unspecified: Secondary | ICD-10-CM | POA: Diagnosis not present

## 2021-05-23 DIAGNOSIS — J301 Allergic rhinitis due to pollen: Secondary | ICD-10-CM | POA: Diagnosis not present

## 2021-05-23 DIAGNOSIS — M170 Bilateral primary osteoarthritis of knee: Secondary | ICD-10-CM | POA: Diagnosis not present

## 2021-06-14 IMAGING — MG MM DIGITAL SCREENING BILAT W/ TOMO AND CAD
8 series · 8 of 24 positions shown · non-contrast
Comparison: Previous exam(s).

ACR Breast Density Category a: The breast tissue is almost entirely
fatty.

CLINICAL DATA: Screening.

EXAM:
DIGITAL SCREENING BILATERAL MAMMOGRAM WITH TOMOSYNTHESIS AND CAD
TECHNIQUE: Bilateral screening digital craniocaudal and mediolateral oblique
mammograms were obtained. Bilateral screening digital breast
tomosynthesis was performed. The images were evaluated with
computer-aided detection.

[L CC synth-2D]
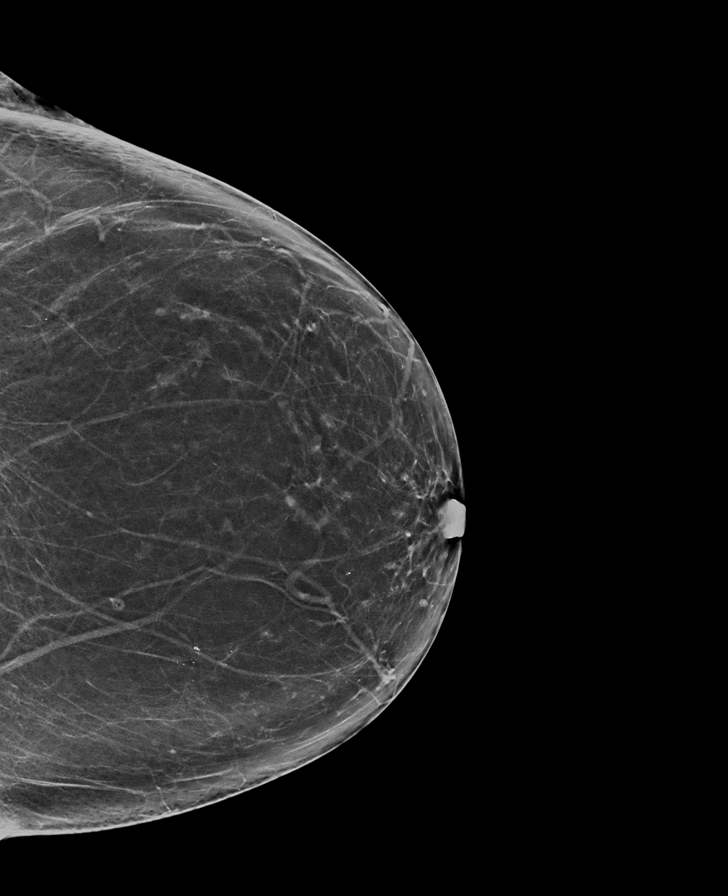

[R CC synth-2D]
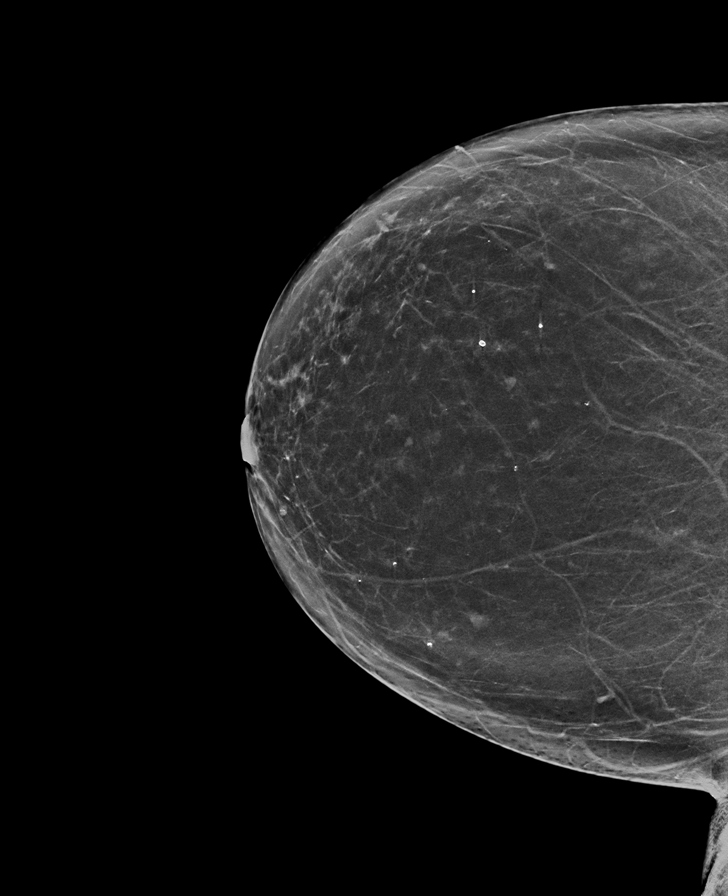

[L MLO synth-2D]
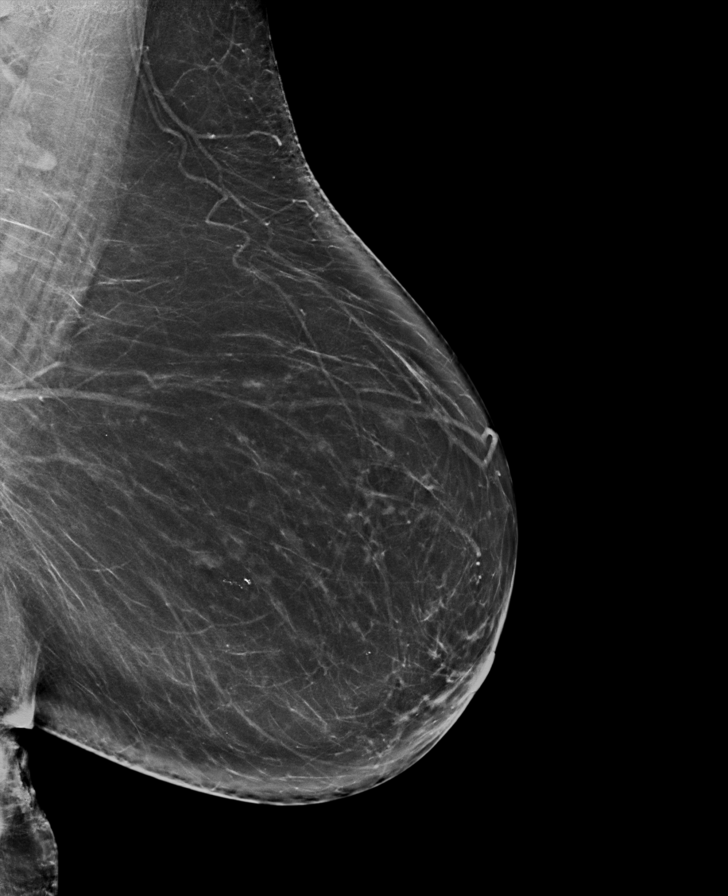

[R MLO synth-2D]
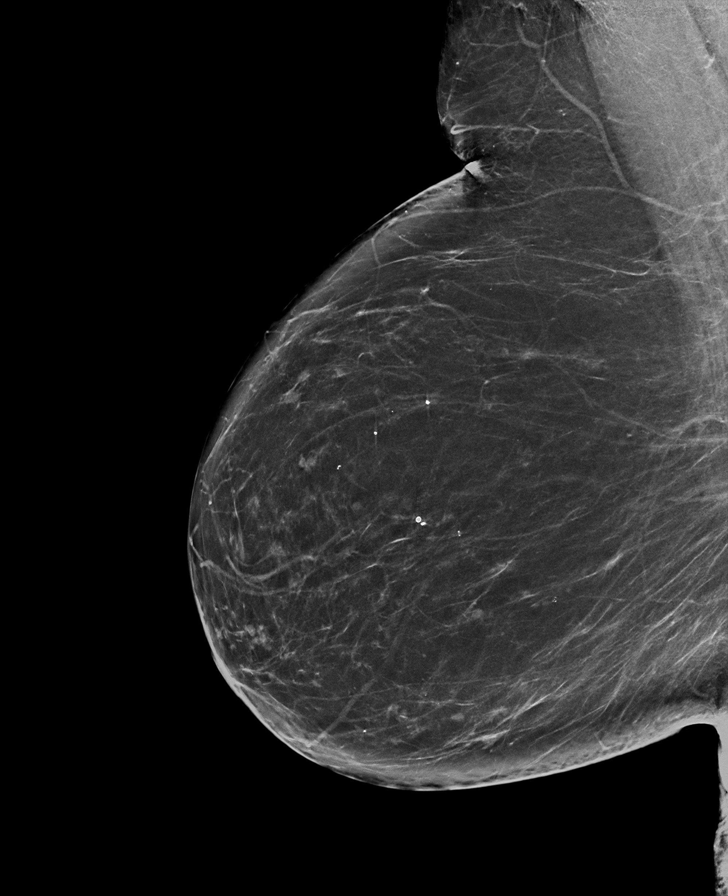

[R CC tomo · tomo slice 37/72.0]
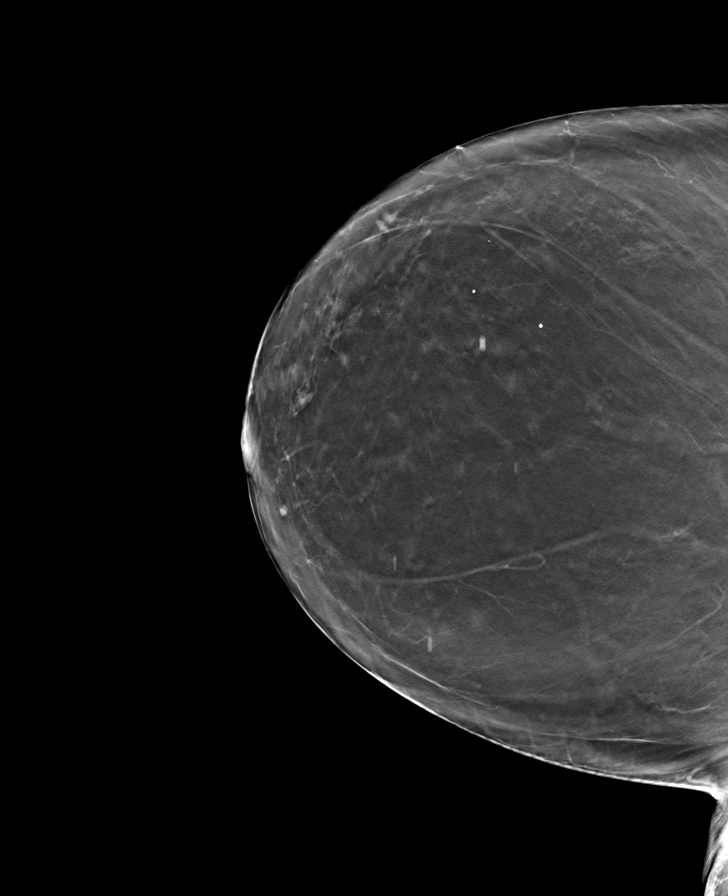

[L CC tomo · tomo slice 37/73.0]
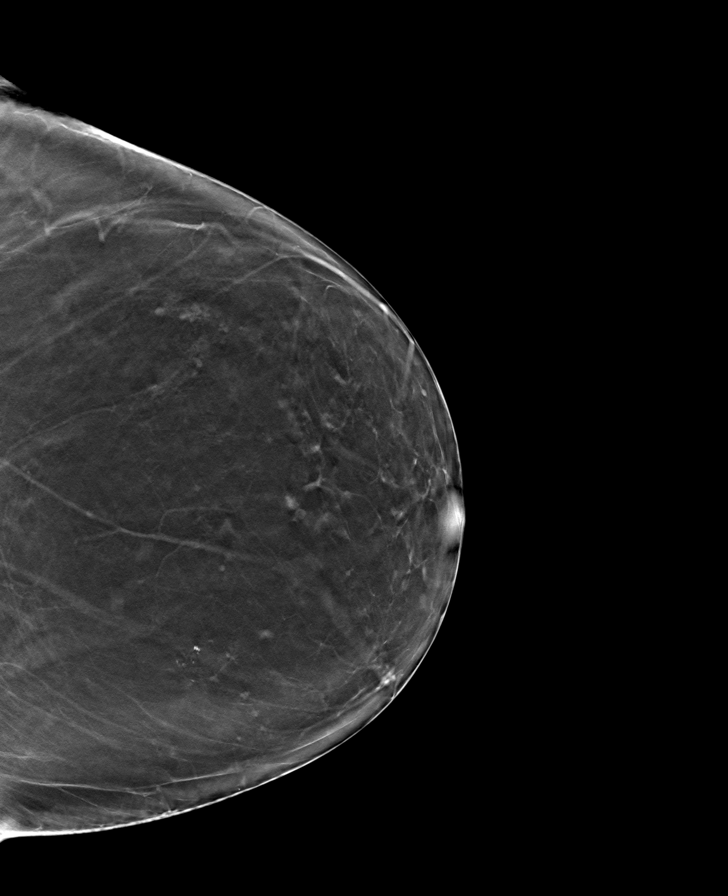

[R MLO tomo · tomo slice 39/77.0]
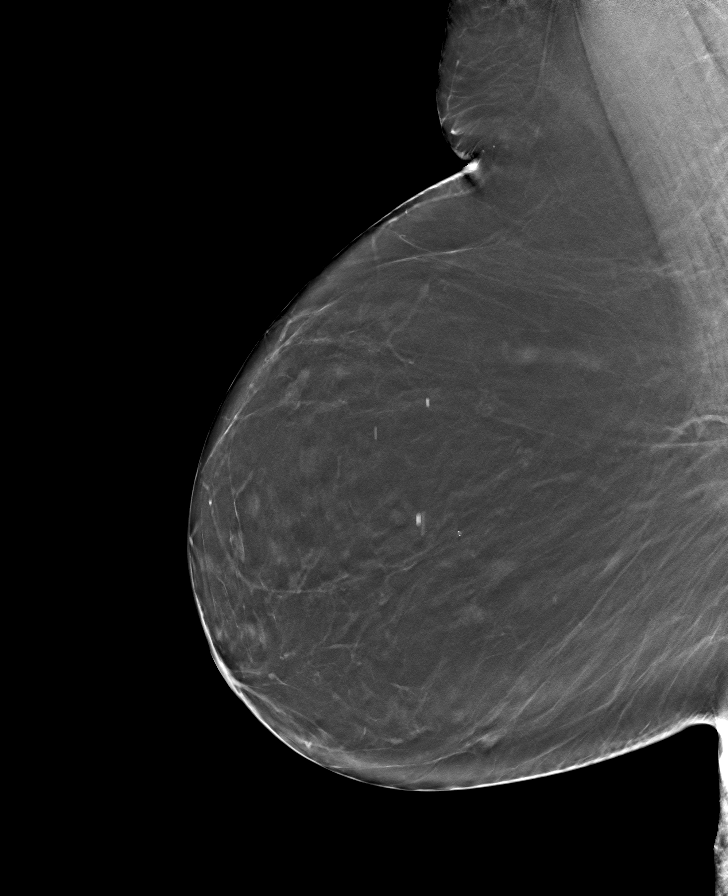

[L MLO tomo · tomo slice 41/81.0]
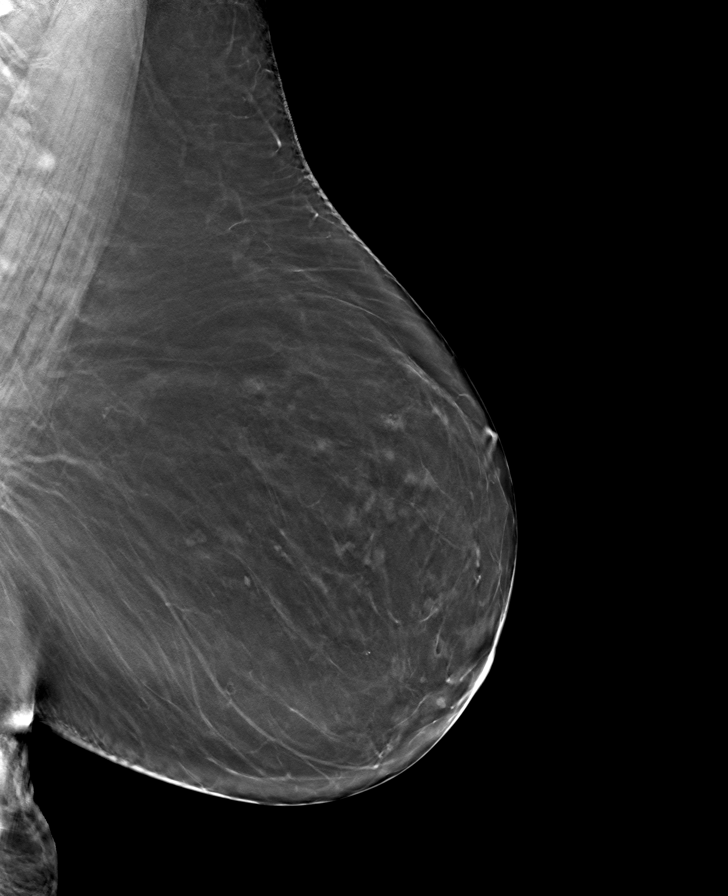

[8 of 24 positions shown; findings below may reference images not displayed]

FINDINGS: There are no findings suspicious for malignancy. The images were
evaluated with computer-aided detection.
IMPRESSION: No mammographic evidence of malignancy. A result letter of this
screening mammogram will be mailed directly to the patient.

RECOMMENDATION:
Screening mammogram in one year. (Code:JP-J-DD5)

BI-RADS CATEGORY  1: Negative.

## 2021-09-27 ENCOUNTER — Other Ambulatory Visit: Payer: Self-pay | Admitting: Family Medicine

## 2021-09-27 DIAGNOSIS — Z1231 Encounter for screening mammogram for malignant neoplasm of breast: Secondary | ICD-10-CM

## 2021-10-01 ENCOUNTER — Ambulatory Visit
Admission: RE | Admit: 2021-10-01 | Discharge: 2021-10-01 | Disposition: A | Discharge status: Home / Self Care | Payer: PPO | Source: Ambulatory Visit | Attending: Family Medicine | Admitting: Family Medicine

## 2021-10-01 DIAGNOSIS — Z1231 Encounter for screening mammogram for malignant neoplasm of breast: Secondary | ICD-10-CM | POA: Diagnosis not present

## 2022-02-25 DIAGNOSIS — E119 Type 2 diabetes mellitus without complications: Secondary | ICD-10-CM | POA: Diagnosis not present

## 2022-02-25 DIAGNOSIS — J301 Allergic rhinitis due to pollen: Secondary | ICD-10-CM | POA: Diagnosis not present

## 2022-02-25 DIAGNOSIS — E7800 Pure hypercholesterolemia, unspecified: Secondary | ICD-10-CM | POA: Diagnosis not present

## 2022-02-25 DIAGNOSIS — K219 Gastro-esophageal reflux disease without esophagitis: Secondary | ICD-10-CM | POA: Diagnosis not present

## 2022-02-25 DIAGNOSIS — M170 Bilateral primary osteoarthritis of knee: Secondary | ICD-10-CM | POA: Diagnosis not present

## 2022-02-25 DIAGNOSIS — Z0000 Encounter for general adult medical examination without abnormal findings: Secondary | ICD-10-CM | POA: Diagnosis not present

## 2022-02-25 DIAGNOSIS — F419 Anxiety disorder, unspecified: Secondary | ICD-10-CM | POA: Diagnosis not present

## 2022-09-12 ENCOUNTER — Other Ambulatory Visit: Payer: Self-pay | Admitting: Family Medicine

## 2022-09-12 DIAGNOSIS — Z1231 Encounter for screening mammogram for malignant neoplasm of breast: Secondary | ICD-10-CM

## 2022-09-15 DIAGNOSIS — F419 Anxiety disorder, unspecified: Secondary | ICD-10-CM | POA: Diagnosis not present

## 2022-09-15 DIAGNOSIS — E119 Type 2 diabetes mellitus without complications: Secondary | ICD-10-CM | POA: Diagnosis not present

## 2022-09-15 DIAGNOSIS — E7800 Pure hypercholesterolemia, unspecified: Secondary | ICD-10-CM | POA: Diagnosis not present

## 2022-09-15 DIAGNOSIS — K219 Gastro-esophageal reflux disease without esophagitis: Secondary | ICD-10-CM | POA: Diagnosis not present

## 2022-09-15 DIAGNOSIS — M170 Bilateral primary osteoarthritis of knee: Secondary | ICD-10-CM | POA: Diagnosis not present

## 2022-09-15 DIAGNOSIS — J301 Allergic rhinitis due to pollen: Secondary | ICD-10-CM | POA: Diagnosis not present

## 2022-10-08 ENCOUNTER — Ambulatory Visit
Admission: RE | Admit: 2022-10-08 | Discharge: 2022-10-08 | Disposition: A | Discharge status: Home / Self Care | Payer: PPO | Source: Ambulatory Visit | Attending: Family Medicine | Admitting: Family Medicine

## 2022-10-08 DIAGNOSIS — Z1231 Encounter for screening mammogram for malignant neoplasm of breast: Secondary | ICD-10-CM | POA: Insufficient documentation

## 2023-02-12 DIAGNOSIS — R5383 Other fatigue: Secondary | ICD-10-CM | POA: Diagnosis not present

## 2023-02-12 DIAGNOSIS — J309 Allergic rhinitis, unspecified: Secondary | ICD-10-CM | POA: Diagnosis not present

## 2023-02-12 DIAGNOSIS — Z20822 Contact with and (suspected) exposure to covid-19: Secondary | ICD-10-CM | POA: Diagnosis not present

## 2023-02-16 DIAGNOSIS — R5383 Other fatigue: Secondary | ICD-10-CM | POA: Diagnosis not present

## 2023-02-16 DIAGNOSIS — J309 Allergic rhinitis, unspecified: Secondary | ICD-10-CM | POA: Diagnosis not present

## 2023-02-16 DIAGNOSIS — Z20822 Contact with and (suspected) exposure to covid-19: Secondary | ICD-10-CM | POA: Diagnosis not present

## 2023-02-20 DIAGNOSIS — R5383 Other fatigue: Secondary | ICD-10-CM | POA: Diagnosis not present

## 2023-02-20 DIAGNOSIS — J309 Allergic rhinitis, unspecified: Secondary | ICD-10-CM | POA: Diagnosis not present

## 2023-02-20 DIAGNOSIS — Z20822 Contact with and (suspected) exposure to covid-19: Secondary | ICD-10-CM | POA: Diagnosis not present

## 2023-02-23 DIAGNOSIS — J309 Allergic rhinitis, unspecified: Secondary | ICD-10-CM | POA: Diagnosis not present

## 2023-02-23 DIAGNOSIS — R5383 Other fatigue: Secondary | ICD-10-CM | POA: Diagnosis not present

## 2023-02-23 DIAGNOSIS — Z20822 Contact with and (suspected) exposure to covid-19: Secondary | ICD-10-CM | POA: Diagnosis not present

## 2023-02-28 DIAGNOSIS — J309 Allergic rhinitis, unspecified: Secondary | ICD-10-CM | POA: Diagnosis not present

## 2023-02-28 DIAGNOSIS — Z20822 Contact with and (suspected) exposure to covid-19: Secondary | ICD-10-CM | POA: Diagnosis not present

## 2023-02-28 DIAGNOSIS — R5383 Other fatigue: Secondary | ICD-10-CM | POA: Diagnosis not present

## 2023-03-03 DIAGNOSIS — R5383 Other fatigue: Secondary | ICD-10-CM | POA: Diagnosis not present

## 2023-03-03 DIAGNOSIS — Z20822 Contact with and (suspected) exposure to covid-19: Secondary | ICD-10-CM | POA: Diagnosis not present

## 2023-03-03 DIAGNOSIS — J309 Allergic rhinitis, unspecified: Secondary | ICD-10-CM | POA: Diagnosis not present

## 2023-03-08 DIAGNOSIS — J309 Allergic rhinitis, unspecified: Secondary | ICD-10-CM | POA: Diagnosis not present

## 2023-03-08 DIAGNOSIS — R5383 Other fatigue: Secondary | ICD-10-CM | POA: Diagnosis not present

## 2023-03-08 DIAGNOSIS — Z20822 Contact with and (suspected) exposure to covid-19: Secondary | ICD-10-CM | POA: Diagnosis not present

## 2023-03-11 DIAGNOSIS — J309 Allergic rhinitis, unspecified: Secondary | ICD-10-CM | POA: Diagnosis not present

## 2023-03-11 DIAGNOSIS — R5383 Other fatigue: Secondary | ICD-10-CM | POA: Diagnosis not present

## 2023-03-11 DIAGNOSIS — Z20822 Contact with and (suspected) exposure to covid-19: Secondary | ICD-10-CM | POA: Diagnosis not present

## 2023-04-10 DIAGNOSIS — K219 Gastro-esophageal reflux disease without esophagitis: Secondary | ICD-10-CM | POA: Diagnosis not present

## 2023-04-10 DIAGNOSIS — E119 Type 2 diabetes mellitus without complications: Secondary | ICD-10-CM | POA: Diagnosis not present

## 2023-04-10 DIAGNOSIS — J301 Allergic rhinitis due to pollen: Secondary | ICD-10-CM | POA: Diagnosis not present

## 2023-04-10 DIAGNOSIS — M170 Bilateral primary osteoarthritis of knee: Secondary | ICD-10-CM | POA: Diagnosis not present

## 2023-04-10 DIAGNOSIS — E7800 Pure hypercholesterolemia, unspecified: Secondary | ICD-10-CM | POA: Diagnosis not present

## 2023-04-10 DIAGNOSIS — Z0000 Encounter for general adult medical examination without abnormal findings: Secondary | ICD-10-CM | POA: Diagnosis not present

## 2023-04-10 DIAGNOSIS — F419 Anxiety disorder, unspecified: Secondary | ICD-10-CM | POA: Diagnosis not present

## 2023-07-27 DIAGNOSIS — F419 Anxiety disorder, unspecified: Secondary | ICD-10-CM | POA: Diagnosis not present

## 2023-07-27 DIAGNOSIS — E7800 Pure hypercholesterolemia, unspecified: Secondary | ICD-10-CM | POA: Diagnosis not present

## 2023-07-27 DIAGNOSIS — M170 Bilateral primary osteoarthritis of knee: Secondary | ICD-10-CM | POA: Diagnosis not present

## 2023-07-27 DIAGNOSIS — E119 Type 2 diabetes mellitus without complications: Secondary | ICD-10-CM | POA: Diagnosis not present

## 2023-09-25 ENCOUNTER — Other Ambulatory Visit: Payer: Self-pay | Admitting: Family Medicine

## 2023-09-25 DIAGNOSIS — Z1231 Encounter for screening mammogram for malignant neoplasm of breast: Secondary | ICD-10-CM

## 2023-10-14 ENCOUNTER — Ambulatory Visit
Admission: RE | Admit: 2023-10-14 | Discharge: 2023-10-14 | Disposition: A | Discharge status: Home / Self Care | Source: Ambulatory Visit | Attending: Family Medicine | Admitting: Family Medicine

## 2023-10-14 DIAGNOSIS — Z1231 Encounter for screening mammogram for malignant neoplasm of breast: Secondary | ICD-10-CM

## 2023-10-20 DIAGNOSIS — F419 Anxiety disorder, unspecified: Secondary | ICD-10-CM | POA: Diagnosis not present

## 2023-10-20 DIAGNOSIS — K219 Gastro-esophageal reflux disease without esophagitis: Secondary | ICD-10-CM | POA: Diagnosis not present

## 2023-10-20 DIAGNOSIS — Z1331 Encounter for screening for depression: Secondary | ICD-10-CM | POA: Diagnosis not present

## 2023-10-20 DIAGNOSIS — M129 Arthropathy, unspecified: Secondary | ICD-10-CM | POA: Diagnosis not present

## 2023-10-20 DIAGNOSIS — E7800 Pure hypercholesterolemia, unspecified: Secondary | ICD-10-CM | POA: Diagnosis not present

## 2023-10-20 DIAGNOSIS — J301 Allergic rhinitis due to pollen: Secondary | ICD-10-CM | POA: Diagnosis not present

## 2023-10-20 DIAGNOSIS — R011 Cardiac murmur, unspecified: Secondary | ICD-10-CM | POA: Diagnosis not present

## 2023-10-20 DIAGNOSIS — E119 Type 2 diabetes mellitus without complications: Secondary | ICD-10-CM | POA: Diagnosis not present

## 2023-11-03 DIAGNOSIS — R011 Cardiac murmur, unspecified: Secondary | ICD-10-CM | POA: Diagnosis not present

## 2024-01-27 DIAGNOSIS — E7800 Pure hypercholesterolemia, unspecified: Secondary | ICD-10-CM | POA: Diagnosis not present

## 2024-01-27 DIAGNOSIS — E119 Type 2 diabetes mellitus without complications: Secondary | ICD-10-CM | POA: Diagnosis not present

## 2024-01-27 DIAGNOSIS — F419 Anxiety disorder, unspecified: Secondary | ICD-10-CM | POA: Diagnosis not present

## 2024-01-27 DIAGNOSIS — M129 Arthropathy, unspecified: Secondary | ICD-10-CM | POA: Diagnosis not present

## 2024-05-25 NOTE — Progress Notes (Signed)
 Julie Downs                                          MRN: [id number]   05/25/2024   The [facility] Quality Team Specialist reviewed this patient medical record for the purposes of chart review for care gap closure. The following were reviewed: abstraction for care gap closure-kidney health evaluation for diabetes:eGFR  and uACR.    [facility] Quality Team
